# Patient Record
Sex: Male | Born: 1985 | Race: Black or African American | Hispanic: No | Marital: Single | Smoking: Never smoker
Health system: Southern US, Community
[De-identification: ages and names within clinical notes are randomized; demographics above are authoritative.]

## PROBLEM LIST (undated history)

## (undated) DIAGNOSIS — M795 Residual foreign body in soft tissue: Secondary | ICD-10-CM

## (undated) DIAGNOSIS — Z789 Other specified health status: Secondary | ICD-10-CM

## (undated) HISTORY — PX: NO PAST SURGERIES: SHX2092

---

## 2013-03-26 ENCOUNTER — Encounter (HOSPITAL_BASED_OUTPATIENT_CLINIC_OR_DEPARTMENT_OTHER): Payer: Self-pay | Admitting: Student

## 2013-03-26 ENCOUNTER — Emergency Department (HOSPITAL_BASED_OUTPATIENT_CLINIC_OR_DEPARTMENT_OTHER)
Admission: EM | Admit: 2013-03-26 | Discharge: 2013-03-26 | Disposition: A | Discharge status: Home / Self Care | Payer: Self-pay | Attending: Emergency Medicine | Admitting: Emergency Medicine

## 2013-03-26 DIAGNOSIS — L03211 Cellulitis of face: Secondary | ICD-10-CM

## 2013-03-26 DIAGNOSIS — L0201 Cutaneous abscess of face: Secondary | ICD-10-CM | POA: Insufficient documentation

## 2013-03-26 MED ORDER — CEPHALEXIN 250 MG PO CAPS
500.0000 mg | ORAL_CAPSULE | Freq: Once | ORAL | Status: AC
  Administered 2013-03-26: 500 mg via ORAL
  Filled 2013-03-26: qty 2

## 2013-03-26 MED ORDER — SULFAMETHOXAZOLE-TRIMETHOPRIM 800-160 MG PO TABS: 1.0000 | ORAL_TABLET | Freq: Two times a day (BID) | ORAL | Status: AC

## 2013-03-26 MED ORDER — CEPHALEXIN 500 MG PO CAPS: 500.0000 mg | ORAL_CAPSULE | Freq: Three times a day (TID) | ORAL | Status: DC

## 2013-03-26 MED ORDER — SULFAMETHOXAZOLE-TMP DS 800-160 MG PO TABS
1.0000 | ORAL_TABLET | Freq: Once | ORAL | Status: AC
  Administered 2013-03-26: 1 via ORAL
  Filled 2013-03-26: qty 1

## 2013-03-26 NOTE — ED Notes (Signed)
Pt in with c/o abscess on left outer ear x 3 days and reports possible exposure to MRSA

## 2013-03-26 NOTE — ED Provider Notes (Signed)
History     CSN: [id number]  Arrival date & time 03/26/13  1036   First MD Initiated Contact with Patient 03/26/13 1108      Chief Complaint  Patient presents with  . Abscess    outer left ear    (Consider location/radiation/quality/duration/timing/severity/associated sxs/prior treatment) HPI Comments: Pt had a small painful bump to left cheek just in front of left ear, possible bug bite.  Pt reports GF had a bump on arm, seen in ED and diagnosed with MRSA.  Pt denies prior h/o MRSA or abscess.  Pt tried to drain it himself, got it to open and drain some, but now has gotten more red and swollen adn painful, redness has spread a little to face.  No fevers, chills.    Patient is a 27 y.o. male presenting with abscess. The history is provided by the patient.  Abscess Location:  Head/neck Abscess quality: induration, painful, redness and warmth   Red streaking: no   Duration:  2 days Progression:  Worsening Pain details:    Quality:  Throbbing and aching   Severity:  Moderate   Duration:  1 day Chronicity:  New Context: skin injury   Context: not immunosuppression   Associated symptoms: no fever     History reviewed. No pertinent past medical history.  History reviewed. No pertinent past surgical history.  History reviewed. No pertinent family history.  History  Substance Use Topics  . Smoking status: Never Smoker   . Smokeless tobacco: Not on file  . Alcohol Use: Yes      Review of Systems  Constitutional: Negative for fever and chills.  HENT: Positive for facial swelling. Negative for hearing loss, neck pain, neck stiffness and tinnitus.   Skin: Positive for color change and wound.  Hematological: Negative for adenopathy.    Allergies  Review of patient's allergies indicates not on file.  Home Medications   Current Outpatient Rx  Name  Route  Sig  Dispense  Refill  . cephALEXin (KEFLEX) 500 MG capsule   Oral   Take 1 capsule (500 mg total) by mouth 3  (three) times daily.   30 capsule   0   . sulfamethoxazole-trimethoprim (BACTRIM DS,SEPTRA DS) 800-160 MG per tablet   Oral   Take 1 tablet by mouth 2 (two) times daily.   20 tablet   0     BP 128/87  Pulse 101  Temp(Src) 99.1 F (37.3 C) (Oral)  Resp 20  Ht 5\' 5"  (1.651 m)  Wt 120 lb (54.432 kg)  BMI 19.97 kg/m2  SpO2 100%  Physical Exam  Nursing note and vitals reviewed. Constitutional: He appears well-developed and well-nourished.  HENT:  Head: Normocephalic.    Eyes: EOM are normal.    ED Course  Procedures (including critical care time)  Labs Reviewed - No data to display No results found.   1. Cellulitis and abscess of face       MDM  Small abscess or furuncle, possibly began as ingrown hair or folliculitis, will treat with both keflex and bactrim, I also suspect early MRSA infection.  No significant area of fluctuance that requires I&D.        Gavin Pound. Kylina Vultaggio, MD 03/26/13 1137

## 2013-03-26 NOTE — Discharge Instructions (Signed)
Community-Associated MRSA °CA-MRSA stands for community-associated methicillin-resistant Staphylococcus aureus. MRSA is a type of bacteria that is resistant to some common antibiotics. It can cause infections in the skin and many other places in the body. Staphylococcus aureus, often called "staph," is a bacteria that normally lives on the skin or in the nose. Staph on the surface of the skin or in the nose does not cause problems. However, if the staph enters the body through a cut, wound, or break in the skin, an infection can happen. °Up until recently, infections with the MRSA type of staph mainly occurred in hospitals and other healthcare settings. There are now increasing problems with MRSA infections in the community as well. Infections with MRSA may be very serious or even life-threatening. °CA-MRSA is becoming more common. It is known to spread in crowded settings, in jails and prisons, and in situations where there is close skin-to-skin contact, such as during sporting events or in locker rooms. MRSA can be spread through shared items, such as children's toys, razors, towels, or sports equipment.  °CAUSES °All staph, including MRSA, are normally harmless unless they enter the body through a scratch, cut, or wound, such as with surgery. All staph, including MRSA, can be spread from person-to-person by touching contaminated objects or through direct contact. °· MRSA now causes illness in people who have not been in hospitals or other healthcare facilities. Cases of MRSA diseases in the community have been associated with: °· Recent antibiotic use. °· Sharing contaminated towels or clothes. °· Having active skin diseases. °· Participating in contact sports. °· Living in crowded settings. °· Intravenous (IV) drug use. °· Community-associated MRSA infections are usually skin infections, but may cause other severe illnesses. °· Staph bacteria are one of the most common causes of skin infection. However, they are  also a common cause of pneumonia, bone or joint infections, and bloodstream infections. °DIAGNOSIS °Diagnosis of MRSA is done by cultures of fluid samples that may come from: °· Swabs taken from cuts or wounds in infected areas. °· Nasal swabs. °· Saliva or deep cough specimens from the lungs (sputum). °· Urine. °· Blood. °Many people are "colonized" with MRSA but have no signs of infection. This means that people carry the MRSA germ on their skin or in their nose and may never develop MRSA infection.  °TREATMENT  °Treatment varies and is based on how serious, how deep, or how extensive the infection is. For example: °· Some skin infections, such as a small boil or abscess, may be treated by draining yellowish-white fluid (pus) from the site of the infection. °· Deeper or more widespread soft tissue infections are usually treated with surgery to drain pus and with antibiotic medicine given by vein or by mouth. This may be recommended even if you are pregnant. °· Serious infections may require a hospital stay. °If antibiotics are given, they may be needed for several weeks. °PREVENTION °Because many people are colonized with staph, including MRSA, preventing the spread of the bacteria from person-to-person is most important. The best way to prevent the spread of bacteria and other germs is through proper hand washing or by using alcohol-based hand disinfectants. The following are other ways to help prevent MRSA infection within community settings.  °· Wash your hands frequently with soap and water for at least 15 seconds. Otherwise, use alcohol-based hand disinfectants when soap and water is not available. °· Make sure people who live with you wash their hands often, too. °· Do not share   personal items. For example, avoid sharing razors and other personal hygiene items, towels, clothing, and athletic equipment. °· Wash and dry your clothes and bedding at the warmest temperatures recommended on the labels. °· Keep  wounds covered. Pus from infected sores may contain MRSA and other bacteria. Keep cuts and abrasions clean and covered with germ-free (sterile), dry bandages until they are healed. °· If you have a wound that appears infected, ask your caregiver if a culture for MRSA and other bacteria should be done. °· If you are breastfeeding, talk to your caregiver about MRSA. You may be asked to temporarily stop breastfeeding. °HOME CARE INSTRUCTIONS  °· Take your antibiotics as directed. Finish them even if you start to feel better. °· Avoid close contact with those around you as much as possible. Do not use towels, razors, toothbrushes, bedding, or other items that will be used by others. °· To fight the infection, follow your caregiver's instructions for wound care. Wash your hands before and after changing your bandages. °· If you have an intravascular device, such as a catheter, make sure you know how to care for it. °· Be sure to tell any healthcare providers that you have MRSA so they are aware of your infection. °SEEK IMMEDIATE MEDICAL CARE IF: °· The infection appears to be getting worse. Signs include: °· Increased warmth, redness, or tenderness around the wound site. °· A red line that extends from the infection site. °· A dark color in the area around the infection. °· Wound drainage that is tan, yellow, or green. °· A bad smell coming from the wound. °· You feel sick to your stomach (nauseous) and throw up (vomit) or cannot keep medicine down. °· You have a fever. °· Your baby is older than 3 months with a rectal temperature of 102° F (38.9° C) or higher. °· Your baby is 3 months old or younger with a rectal temperature of 100.4° F (38° C) or higher. °· You have difficulty breathing. °MAKE SURE YOU:  °· Understand these instructions. °· Will watch your condition. °· Will get help right away if you are not doing well or get worse. °Document Released: 12/28/2005 Document Revised: 12/17/2011 Document Reviewed:  12/28/2010 °[employer]® Patient Information ©2014 [employer]. ° °

## 2017-09-30 ENCOUNTER — Encounter (HOSPITAL_COMMUNITY): Payer: Self-pay | Admitting: Emergency Medicine

## 2017-09-30 ENCOUNTER — Inpatient Hospital Stay (HOSPITAL_COMMUNITY)
Admission: EM | Admit: 2017-09-30 | Discharge: 2017-10-01 | DRG: 563 | Disposition: A | Discharge status: Home / Self Care | Payer: PRIVATE HEALTH INSURANCE | Attending: Emergency Medicine | Admitting: Emergency Medicine

## 2017-09-30 ENCOUNTER — Inpatient Hospital Stay (HOSPITAL_COMMUNITY): Payer: PRIVATE HEALTH INSURANCE

## 2017-09-30 DIAGNOSIS — W3400XA Accidental discharge from unspecified firearms or gun, initial encounter: Secondary | ICD-10-CM

## 2017-09-30 DIAGNOSIS — M795 Residual foreign body in soft tissue: Secondary | ICD-10-CM

## 2017-09-30 DIAGNOSIS — S52002B Unspecified fracture of upper end of left ulna, initial encounter for open fracture type I or II: Secondary | ICD-10-CM | POA: Diagnosis present

## 2017-09-30 DIAGNOSIS — Z23 Encounter for immunization: Secondary | ICD-10-CM

## 2017-09-30 HISTORY — DX: Residual foreign body in soft tissue: M79.5

## 2017-09-30 LAB — CBC WITH DIFFERENTIAL/PLATELET
Basophils Absolute: 0 10*3/uL (ref 0.0–0.1)
Basophils Relative: 1 %
EOS ABS: 0.1 10*3/uL (ref 0.0–0.7)
Eosinophils Relative: 1 %
HEMATOCRIT: 43.4 % (ref 39.0–52.0)
HEMOGLOBIN: 15.5 g/dL (ref 13.0–17.0)
LYMPHS ABS: 2.8 10*3/uL (ref 0.7–4.0)
LYMPHS PCT: 32 %
MCH: 30.9 pg (ref 26.0–34.0)
MCHC: 35.7 g/dL (ref 30.0–36.0)
MCV: 86.6 fL (ref 78.0–100.0)
MONOS PCT: 4 %
Monocytes Absolute: 0.3 10*3/uL (ref 0.1–1.0)
NEUTROS ABS: 5.4 10*3/uL (ref 1.7–7.7)
Neutrophils Relative %: 62 %
Platelets: 295 10*3/uL (ref 150–400)
RBC: 5.01 MIL/uL (ref 4.22–5.81)
RDW: 12.8 % (ref 11.5–15.5)
WBC: 8.6 10*3/uL (ref 4.0–10.5)

## 2017-09-30 LAB — BASIC METABOLIC PANEL
Anion gap: 13 (ref 5–15)
BUN: 6 mg/dL (ref 6–20)
CHLORIDE: 106 mmol/L (ref 101–111)
CO2: 22 mmol/L (ref 22–32)
CREATININE: 1.13 mg/dL (ref 0.61–1.24)
Calcium: 9 mg/dL (ref 8.9–10.3)
GFR calc Af Amer: >60 mL/min (ref >60)
GFR calc non Af Amer: >60 mL/min (ref >60)
GLUCOSE: 122 mg/dL — AB (ref 65–99)
POTASSIUM: 3.1 mmol/L — AB (ref 3.5–5.1)
Sodium: 141 mmol/L (ref 135–145)

## 2017-09-30 LAB — PROTIME-INR
INR: 1.06
Prothrombin Time: 13.7 seconds (ref 11.4–15.2)

## 2017-09-30 LAB — ABO/RH: ABO/RH(D): B NEG

## 2017-09-30 MED ORDER — HYDROCODONE-ACETAMINOPHEN 5-325 MG PO TABS: 1.0000 | ORAL_TABLET | Freq: Four times a day (QID) | ORAL | 0 refills | Status: AC | PRN

## 2017-09-30 MED ORDER — METOPROLOL TARTRATE 5 MG/5ML IV SOLN: 5.0000 mg | Freq: Four times a day (QID) | INTRAVENOUS | Status: DC | PRN

## 2017-09-30 MED ORDER — SODIUM CHLORIDE 0.9 % IV BOLUS (SEPSIS)
1000.0000 mL | Freq: Once | INTRAVENOUS | Status: AC
  Administered 2017-09-30: 1000 mL via INTRAVENOUS

## 2017-09-30 MED ORDER — ONDANSETRON 4 MG PO TBDP: 4.0000 mg | ORAL_TABLET | Freq: Four times a day (QID) | ORAL | Status: DC | PRN

## 2017-09-30 MED ORDER — TETANUS-DIPHTH-ACELL PERTUSSIS 5-2.5-18.5 LF-MCG/0.5 IM SUSP
0.5000 mL | Freq: Once | INTRAMUSCULAR | Status: AC
  Administered 2017-09-30: 0.5 mL via INTRAMUSCULAR

## 2017-09-30 MED ORDER — TETANUS-DIPHTH-ACELL PERTUSSIS 5-2.5-18.5 LF-MCG/0.5 IM SUSP
INTRAMUSCULAR | Status: AC
  Filled 2017-09-30: qty 0.5

## 2017-09-30 MED ORDER — CEPHALEXIN 500 MG PO CAPS: 500.0000 mg | ORAL_CAPSULE | Freq: Two times a day (BID) | ORAL | 0 refills | Status: AC

## 2017-09-30 MED ORDER — ACETAMINOPHEN 325 MG PO TABS: 650.0000 mg | ORAL_TABLET | ORAL | Status: DC | PRN

## 2017-09-30 MED ORDER — CEFAZOLIN SODIUM-DEXTROSE 2-4 GM/100ML-% IV SOLN
INTRAVENOUS | Status: AC
  Filled 2017-09-30: qty 100

## 2017-09-30 MED ORDER — FENTANYL CITRATE (PF) 100 MCG/2ML IJ SOLN
INTRAMUSCULAR | Status: AC
  Filled 2017-09-30: qty 2

## 2017-09-30 MED ORDER — ONDANSETRON HCL 4 MG/2ML IJ SOLN: 4.0000 mg | Freq: Four times a day (QID) | INTRAMUSCULAR | Status: DC | PRN

## 2017-09-30 MED ORDER — SODIUM CHLORIDE 0.9 % IV SOLN
INTRAVENOUS | Status: DC
  Administered 2017-09-30: 21:00:00 via INTRAVENOUS

## 2017-09-30 MED ORDER — MORPHINE SULFATE (PF) 4 MG/ML IV SOLN
2.0000 mg | INTRAVENOUS | Status: DC | PRN
  Administered 2017-09-30: 4 mg via INTRAVENOUS
  Filled 2017-09-30: qty 1

## 2017-09-30 MED ORDER — DOCUSATE SODIUM 100 MG PO CAPS: 100.0000 mg | ORAL_CAPSULE | Freq: Two times a day (BID) | ORAL | Status: DC

## 2017-09-30 MED ORDER — FENTANYL CITRATE (PF) 100 MCG/2ML IJ SOLN
50.0000 ug | Freq: Once | INTRAMUSCULAR | Status: AC
  Administered 2017-09-30: 50 ug via INTRAVENOUS

## 2017-09-30 MED ORDER — CEFAZOLIN SODIUM-DEXTROSE 1-4 GM/50ML-% IV SOLN
1.0000 g | Freq: Once | INTRAVENOUS | Status: AC
  Administered 2017-09-30: 1 g via INTRAVENOUS
  Filled 2017-09-30: qty 50

## 2017-09-30 MED ORDER — OXYCODONE HCL 5 MG PO TABS: 5.0000 mg | ORAL_TABLET | ORAL | Status: DC | PRN
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
## 2017-09-30 NOTE — Progress Notes (Signed)
Orthopedic Tech Progress Note Patient Details:  Sergio CorwinKenneth XXXBroughton 09/26/1986 [id number]  Ortho Devices Type of Ortho Device: Post (long arm) splint, Arm sling Ortho Device/Splint Location: lue Ortho Device/Splint Interventions: Ordered, Application, Adjustment   Post Interventions Patient Tolerated: Well Instructions Provided: Care of device, Adjustment of device   Trinna PostMartinez, Erie Sica J 09/30/2017, 11:24 PM

## 2017-09-30 NOTE — ED Notes (Signed)
Paged Hand Doc
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
## 2017-10-01 LAB — BPAM RBC
BLOOD PRODUCT EXPIRATION DATE: 201901102359
BLOOD PRODUCT EXPIRATION DATE: 201901102359
ISSUE DATE / TIME: 201812242023
ISSUE DATE / TIME: 201812242023
UNIT TYPE AND RH: 9500
UNIT TYPE AND RH: 9500

## 2017-10-01 LAB — BPAM FFP
BLOOD PRODUCT EXPIRATION DATE: 201812272359
BLOOD PRODUCT EXPIRATION DATE: 201812272359
ISSUE DATE / TIME: 201812242023
ISSUE DATE / TIME: 201812242023
UNIT TYPE AND RH: 6200
Unit Type and Rh: 6200

## 2017-10-01 LAB — TYPE AND SCREEN
ABO/RH(D): B NEG
Antibody Screen: NEGATIVE
UNIT DIVISION: 0
Unit division: 0

## 2017-10-01 LAB — PREPARE FRESH FROZEN PLASMA
UNIT DIVISION: 0
Unit division: 0

## 2017-10-01 LAB — HIV ANTIBODY (ROUTINE TESTING W REFLEX): HIV Screen 4th Generation wRfx: NONREACTIVE

## 2017-10-03 ENCOUNTER — Encounter (HOSPITAL_BASED_OUTPATIENT_CLINIC_OR_DEPARTMENT_OTHER): Payer: Self-pay | Admitting: Student

## 2017-10-03 ENCOUNTER — Other Ambulatory Visit: Payer: Self-pay | Admitting: Orthopedic Surgery

## 2017-10-04 ENCOUNTER — Encounter (HOSPITAL_BASED_OUTPATIENT_CLINIC_OR_DEPARTMENT_OTHER): Payer: Self-pay | Admitting: Certified Registered Nurse Anesthetist

## 2017-10-04 ENCOUNTER — Ambulatory Visit (HOSPITAL_BASED_OUTPATIENT_CLINIC_OR_DEPARTMENT_OTHER): Payer: PRIVATE HEALTH INSURANCE | Admitting: Certified Registered Nurse Anesthetist

## 2017-10-04 ENCOUNTER — Other Ambulatory Visit: Payer: Self-pay

## 2017-10-04 ENCOUNTER — Encounter (HOSPITAL_BASED_OUTPATIENT_CLINIC_OR_DEPARTMENT_OTHER)
Admission: RE | Disposition: A | Discharge status: Home / Self Care | Payer: Self-pay | Source: Ambulatory Visit | Attending: Orthopedic Surgery

## 2017-10-04 ENCOUNTER — Ambulatory Visit (HOSPITAL_BASED_OUTPATIENT_CLINIC_OR_DEPARTMENT_OTHER)
Admission: RE | Admit: 2017-10-04 | Discharge: 2017-10-04 | Disposition: A | Discharge status: Home / Self Care | Payer: PRIVATE HEALTH INSURANCE | Source: Ambulatory Visit | Attending: Orthopedic Surgery | Admitting: Orthopedic Surgery

## 2017-10-04 DIAGNOSIS — Y939 Activity, unspecified: Secondary | ICD-10-CM | POA: Diagnosis not present

## 2017-10-04 DIAGNOSIS — S52092B Other fracture of upper end of left ulna, initial encounter for open fracture type I or II: Secondary | ICD-10-CM | POA: Insufficient documentation

## 2017-10-04 DIAGNOSIS — S51832A Puncture wound without foreign body of left forearm, initial encounter: Secondary | ICD-10-CM | POA: Diagnosis present

## 2017-10-04 HISTORY — DX: Residual foreign body in soft tissue: M79.5

## 2017-10-04 HISTORY — DX: Other specified health status: Z78.9

## 2017-10-04 HISTORY — PX: ORIF ULNAR FRACTURE: SHX5417

## 2017-10-04 SURGERY — OPEN REDUCTION INTERNAL FIXATION (ORIF) ULNAR FRACTURE
Anesthesia: General | Site: Arm Lower | Laterality: Left

## 2017-10-04 MED ORDER — FENTANYL CITRATE (PF) 100 MCG/2ML IJ SOLN
INTRAMUSCULAR | Status: AC
  Filled 2017-10-04: qty 2

## 2017-10-04 MED ORDER — OXYCODONE HCL 5 MG/5ML PO SOLN
5.0000 mg | Freq: Once | ORAL | Status: DC | PRN
Start: 2017-10-04 — End: 2017-10-04

## 2017-10-04 MED ORDER — HYDROMORPHONE HCL 1 MG/ML IJ SOLN: 0.2500 mg | INTRAMUSCULAR | Status: DC | PRN

## 2017-10-04 MED ORDER — PROMETHAZINE HCL 25 MG/ML IJ SOLN: 6.2500 mg | INTRAMUSCULAR | Status: DC | PRN

## 2017-10-04 MED ORDER — CHLORHEXIDINE GLUCONATE 4 % EX LIQD: 60.0000 mL | Freq: Once | CUTANEOUS | Status: DC

## 2017-10-04 MED ORDER — FENTANYL CITRATE (PF) 100 MCG/2ML IJ SOLN
INTRAMUSCULAR | Status: DC | PRN
  Administered 2017-10-04: 25 ug via INTRAVENOUS

## 2017-10-04 MED ORDER — DEXAMETHASONE SODIUM PHOSPHATE 10 MG/ML IJ SOLN
INTRAMUSCULAR | Status: DC | PRN
  Administered 2017-10-04: 10 mg via INTRAVENOUS

## 2017-10-04 MED ORDER — LIDOCAINE 2% (20 MG/ML) 5 ML SYRINGE
INTRAMUSCULAR | Status: AC
  Filled 2017-10-04: qty 5

## 2017-10-04 MED ORDER — MIDAZOLAM HCL 2 MG/2ML IJ SOLN
1.0000 mg | INTRAMUSCULAR | Status: DC | PRN
  Administered 2017-10-04: 2 mg via INTRAVENOUS

## 2017-10-04 MED ORDER — CEFAZOLIN SODIUM-DEXTROSE 2-4 GM/100ML-% IV SOLN
2.0000 g | INTRAVENOUS | Status: AC
  Administered 2017-10-04: 2 g via INTRAVENOUS

## 2017-10-04 MED ORDER — MEPERIDINE HCL 25 MG/ML IJ SOLN: 6.2500 mg | INTRAMUSCULAR | Status: DC | PRN

## 2017-10-04 MED ORDER — CEFAZOLIN SODIUM-DEXTROSE 2-4 GM/100ML-% IV SOLN
INTRAVENOUS | Status: AC
  Filled 2017-10-04: qty 100

## 2017-10-04 MED ORDER — OXYCODONE HCL 5 MG PO TABS: 5.0000 mg | ORAL_TABLET | Freq: Once | ORAL | Status: DC | PRN

## 2017-10-04 MED ORDER — ROPIVACAINE HCL 5 MG/ML IJ SOLN
INTRAMUSCULAR | Status: DC | PRN
  Administered 2017-10-04: 30 mL via PERINEURAL

## 2017-10-04 MED ORDER — ONDANSETRON HCL 4 MG/2ML IJ SOLN
INTRAMUSCULAR | Status: AC
Start: 2017-10-04 — End: ?
  Filled 2017-10-04: qty 2

## 2017-10-04 MED ORDER — MIDAZOLAM HCL 2 MG/2ML IJ SOLN
INTRAMUSCULAR | Status: AC
  Filled 2017-10-04: qty 2

## 2017-10-04 MED ORDER — OXYCODONE-ACETAMINOPHEN 5-325 MG PO TABS: 1.0000 | ORAL_TABLET | ORAL | 0 refills | Status: AC | PRN

## 2017-10-04 MED ORDER — LACTATED RINGERS IV SOLN
INTRAVENOUS | Status: DC
  Administered 2017-10-04 (×2): via INTRAVENOUS

## 2017-10-04 MED ORDER — FENTANYL CITRATE (PF) 100 MCG/2ML IJ SOLN
50.0000 ug | INTRAMUSCULAR | Status: DC | PRN
  Administered 2017-10-04: 100 ug via INTRAVENOUS

## 2017-10-04 MED ORDER — ONDANSETRON HCL 4 MG/2ML IJ SOLN
INTRAMUSCULAR | Status: DC | PRN
  Administered 2017-10-04: 4 mg via INTRAVENOUS

## 2017-10-04 MED ORDER — PHENYLEPHRINE 40 MCG/ML (10ML) SYRINGE FOR IV PUSH (FOR BLOOD PRESSURE SUPPORT)
PREFILLED_SYRINGE | INTRAVENOUS | Status: DC | PRN
  Administered 2017-10-04 (×3): 80 ug via INTRAVENOUS

## 2017-10-04 MED ORDER — PHENYLEPHRINE 40 MCG/ML (10ML) SYRINGE FOR IV PUSH (FOR BLOOD PRESSURE SUPPORT)
PREFILLED_SYRINGE | INTRAVENOUS | Status: AC
  Filled 2017-10-04: qty 10

## 2017-10-04 MED ORDER — PROPOFOL 10 MG/ML IV BOLUS
INTRAVENOUS | Status: DC | PRN
  Administered 2017-10-04: 200 mg via INTRAVENOUS

## 2017-10-04 MED ORDER — DEXAMETHASONE SODIUM PHOSPHATE 10 MG/ML IJ SOLN
INTRAMUSCULAR | Status: AC
  Filled 2017-10-04: qty 1

## 2017-10-04 MED ORDER — LIDOCAINE 2% (20 MG/ML) 5 ML SYRINGE
INTRAMUSCULAR | Status: DC | PRN
  Administered 2017-10-04: 100 mg via INTRAVENOUS

## 2017-10-04 SURGICAL SUPPLY — 87 items
APL SKNCLS STERI-STRIP NONHPOA (GAUZE/BANDAGES/DRESSINGS) ×1
BAG DECANTER FOR FLEXI CONT (MISCELLANEOUS) IMPLANT
BANDAGE ACE 3X5.8 VEL STRL LF (GAUZE/BANDAGES/DRESSINGS) IMPLANT
BANDAGE ACE 4X5 VEL STRL LF (GAUZE/BANDAGES/DRESSINGS) ×9 IMPLANT
BENZOIN TINCTURE PRP APPL 2/3 (GAUZE/BANDAGES/DRESSINGS) ×3 IMPLANT
BIT DRILL 2.5X2.75 QC CALB (BIT) ×3 IMPLANT
BLADE MINI RND TIP GREEN BEAV (BLADE) IMPLANT
BLADE SURG 15 STRL LF DISP TIS (BLADE) ×2 IMPLANT
BLADE SURG 15 STRL SS (BLADE) ×6
BNDG CMPR 9X4 STRL LF SNTH (GAUZE/BANDAGES/DRESSINGS) ×1
BNDG COHESIVE 4X5 TAN STRL (GAUZE/BANDAGES/DRESSINGS) IMPLANT
BNDG ESMARK 4X9 LF (GAUZE/BANDAGES/DRESSINGS) ×3 IMPLANT
BNDG GAUZE ELAST 4 BULKY (GAUZE/BANDAGES/DRESSINGS) ×3 IMPLANT
CANISTER SUCT 1200ML W/VALVE (MISCELLANEOUS) IMPLANT
CLOSURE WOUND 1/2 X4 (GAUZE/BANDAGES/DRESSINGS) ×1
CORD BIPOLAR FORCEPS 12FT (ELECTRODE) ×3 IMPLANT
COVER BACK TABLE 60X90IN (DRAPES) ×3 IMPLANT
CUFF TOURN SGL LL 18 NRW (TOURNIQUET CUFF) ×3 IMPLANT
CUFF TOURNIQUET SINGLE 18IN (TOURNIQUET CUFF) ×3 IMPLANT
DECANTER SPIKE VIAL GLASS SM (MISCELLANEOUS) IMPLANT
DRAPE EXTREMITY T 121X128X90 (DRAPE) ×3 IMPLANT
DRAPE OEC MINIVIEW 54X84 (DRAPES) ×3 IMPLANT
DRAPE SURG 17X23 STRL (DRAPES) ×3 IMPLANT
DURAPREP 26ML APPLICATOR (WOUND CARE) ×3 IMPLANT
ELECT REM PT RETURN 9FT ADLT (ELECTROSURGICAL)
ELECTRODE REM PT RTRN 9FT ADLT (ELECTROSURGICAL) IMPLANT
GAUZE SPONGE 4X4 12PLY STRL (GAUZE/BANDAGES/DRESSINGS) ×3 IMPLANT
GAUZE SPONGE 4X4 16PLY XRAY LF (GAUZE/BANDAGES/DRESSINGS) IMPLANT
GAUZE XEROFORM 1X8 LF (GAUZE/BANDAGES/DRESSINGS) IMPLANT
GLOVE BIO SURGEON STRL SZ 6.5 (GLOVE) ×2 IMPLANT
GLOVE BIO SURGEONS STRL SZ 6.5 (GLOVE) ×1
GLOVE BIOGEL M STRL SZ7.5 (GLOVE) ×6 IMPLANT
GLOVE BIOGEL PI IND STRL 7.0 (GLOVE) ×1 IMPLANT
GLOVE BIOGEL PI IND STRL 8 (GLOVE) ×2 IMPLANT
GLOVE BIOGEL PI INDICATOR 7.0 (GLOVE) ×2
GLOVE BIOGEL PI INDICATOR 8 (GLOVE) ×4
GLOVE SURG SYN 8.0 (GLOVE) ×6 IMPLANT
GOWN STRL REUS W/ TWL LRG LVL3 (GOWN DISPOSABLE) ×1 IMPLANT
GOWN STRL REUS W/TWL LRG LVL3 (GOWN DISPOSABLE) ×3
GOWN STRL REUS W/TWL XL LVL3 (GOWN DISPOSABLE) ×6 IMPLANT
NEEDLE HYPO 25X1 1.5 SAFETY (NEEDLE) IMPLANT
NS IRRIG 1000ML POUR BTL (IV SOLUTION) ×3 IMPLANT
PACK BASIN DAY SURGERY FS (CUSTOM PROCEDURE TRAY) ×3 IMPLANT
PAD CAST 3X4 CTTN HI CHSV (CAST SUPPLIES) ×1 IMPLANT
PAD CAST 4YDX4 CTTN HI CHSV (CAST SUPPLIES) IMPLANT
PADDING CAST ABS 4INX4YD NS (CAST SUPPLIES) ×2
PADDING CAST ABS COTTON 4X4 ST (CAST SUPPLIES) ×1 IMPLANT
PADDING CAST COTTON 3X4 STRL (CAST SUPPLIES) ×3
PADDING CAST COTTON 4X4 STRL (CAST SUPPLIES)
PENCIL BUTTON HOLSTER BLD 10FT (ELECTRODE) IMPLANT
PLATE LOCK COMP 8H 3.5 FOOT (Plate) ×3 IMPLANT
SCREW CORTICAL 3.5MM  20MM (Screw) ×2 IMPLANT
SCREW CORTICAL 3.5MM 14MM (Screw) ×3 IMPLANT
SCREW CORTICAL 3.5MM 20MM (Screw) ×1 IMPLANT
SCREW CORTICAL 3.5MM 22MM (Screw) ×6 IMPLANT
SCREW CORTICAL 3.5MM 24MM (Screw) ×9 IMPLANT
SHEET MEDIUM DRAPE 40X70 STRL (DRAPES) ×6 IMPLANT
SLING ARM FOAM STRAP LRG (SOFTGOODS) ×3 IMPLANT
SPLINT FAST PLASTER 5X30 (CAST SUPPLIES)
SPLINT PLASTER CAST FAST 5X30 (CAST SUPPLIES) IMPLANT
SPLINT PLASTER CAST XFAST 3X15 (CAST SUPPLIES) IMPLANT
SPLINT PLASTER CAST XFAST 4X15 (CAST SUPPLIES) IMPLANT
SPLINT PLASTER XTRA FAST SET 4 (CAST SUPPLIES)
SPLINT PLASTER XTRA FASTSET 3X (CAST SUPPLIES)
STAPLER VISISTAT 35W (STAPLE) IMPLANT
STOCKINETTE 4X48 STRL (DRAPES) ×3 IMPLANT
STRIP CLOSURE SKIN 1/2X4 (GAUZE/BANDAGES/DRESSINGS) ×2 IMPLANT
SUCTION FRAZIER HANDLE 10FR (MISCELLANEOUS)
SUCTION TUBE FRAZIER 10FR DISP (MISCELLANEOUS) IMPLANT
SUT ETHILON 4 0 PS 2 18 (SUTURE) IMPLANT
SUT MERSILENE 4 0 P 3 (SUTURE) IMPLANT
SUT PROLENE 3 0 PS 2 (SUTURE) ×3 IMPLANT
SUT SILK 2 0 PERMA HAND 18 BK (SUTURE) IMPLANT
SUT VIC AB 0 CT1 27 (SUTURE) ×3
SUT VIC AB 0 CT1 27XBRD ANBCTR (SUTURE) ×1 IMPLANT
SUT VIC AB 0 SH 27 (SUTURE) IMPLANT
SUT VIC AB 2-0 SH 27 (SUTURE) ×3
SUT VIC AB 2-0 SH 27XBRD (SUTURE) ×1 IMPLANT
SUT VIC AB 3-0 FS2 27 (SUTURE) ×3 IMPLANT
SUT VICRYL RAPIDE 4-0 (SUTURE) IMPLANT
SUT VICRYL RAPIDE 4/0 PS 2 (SUTURE) IMPLANT
SYR 10ML LL (SYRINGE) IMPLANT
SYR BULB 3OZ (MISCELLANEOUS) ×3 IMPLANT
TOWEL OR 17X24 6PK STRL BLUE (TOWEL DISPOSABLE) ×6 IMPLANT
TUBE CONNECTING 20'X1/4 (TUBING) ×1
TUBE CONNECTING 20X1/4 (TUBING) ×2 IMPLANT
UNDERPAD 30X30 (UNDERPADS AND DIAPERS) ×3 IMPLANT

## 2017-10-04 NOTE — Progress Notes (Signed)
Assisted Dr. Miller with left, ultrasound guided, supraclavicular block. Side rails up, monitors on throughout procedure. See vital signs in flow sheet. Tolerated Procedure well. 

## 2017-10-04 NOTE — Anesthesia Preprocedure Evaluation (Signed)
Anesthesia Evaluation  Patient identified by MRN, date of birth, ID band Patient awake    Reviewed: Allergy & Precautions, NPO status , Patient's Chart, lab work & pertinent test results  Airway Mallampati: II  TM Distance: >3 FB Neck ROM: Full    Dental no notable dental hx.    Pulmonary neg pulmonary ROS,    Pulmonary exam normal breath sounds clear to auscultation       Cardiovascular negative cardio ROS Normal cardiovascular exam Rhythm:Regular Rate:Normal     Neuro/Psych negative neurological ROS  negative psych ROS   GI/Hepatic negative GI ROS, Neg liver ROS,   Endo/Other  negative endocrine ROS  Renal/GU negative Renal ROS  negative genitourinary   Musculoskeletal negative musculoskeletal ROS (+)   Abdominal   Peds negative pediatric ROS (+)  Hematology negative hematology ROS (+)   Anesthesia Other Findings   Reproductive/Obstetrics negative OB ROS                             Anesthesia Physical Anesthesia Plan  ASA: I  Anesthesia Plan: General   Post-op Pain Management: GA combined w/ Regional for post-op pain   Induction: Intravenous  PONV Risk Score and Plan: 2 and Ondansetron and Midazolam  Airway Management Planned: LMA  Additional Equipment:   Intra-op Plan:   Post-operative Plan: Extubation in OR  Informed Consent: I have reviewed the patients History and Physical, chart, labs and discussed the procedure including the risks, benefits and alternatives for the proposed anesthesia with the patient or authorized representative who has indicated his/her understanding and acceptance.     Dental advisory given  Plan Discussed with: CRNA  Anesthesia Plan Comments:         Anesthesia Quick Evaluation  

## 2017-10-04 NOTE — Discharge Instructions (Signed)

## 2017-10-04 NOTE — Transfer of Care (Signed)
Immediate Anesthesia Transfer of Care Note  Patient: Sergio BlankKenneth Mallicoat  Procedure(s) Performed: OPEN REDUCTION INTERNAL FIXATION (ORIF) ULNAR FRACTURE (Left Arm Lower)  Patient Location: PACU  Anesthesia Type:GA combined with regional for post-op pain  Level of Consciousness: awake, alert  and oriented  Airway & Oxygen Therapy: Patient Spontanous Breathing and Patient connected to face mask oxygen  Post-op Assessment: Report given to RN and Post -op Vital signs reviewed and stable  Post vital signs: Reviewed and stable  Last Vitals:  Vitals:   10/04/17 1400 10/04/17 1415  BP: 99/80 122/84  Pulse: 100 96  Resp: 17 20  Temp:    SpO2: 98% 99%    Last Pain:  Vitals:   10/04/17 1415  TempSrc:   PainSc: 0-No pain      Patients Stated Pain Goal: 3 (10/04/17 1240)  Complications: No apparent anesthesia complications
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
## 2017-10-04 NOTE — Op Note (Signed)
Please see dictated report [phone]

## 2017-10-04 NOTE — Anesthesia Procedure Notes (Signed)
Procedure Name: LMA Insertion Date/Time: 10/04/2017 2:58 PM Performed by: Pearson Grippeobertson, Ketsia Linebaugh M, CRNA Pre-anesthesia Checklist: Patient identified, Emergency Drugs available, Suction available and Patient being monitored Patient Re-evaluated:Patient Re-evaluated prior to induction Oxygen Delivery Method: Circle system utilized Preoxygenation: Pre-oxygenation with 100% oxygen Induction Type: IV induction Ventilation: Mask ventilation without difficulty LMA: LMA inserted LMA Size: 4.0 Number of attempts: 1 Airway Equipment and Method: Bite block Placement Confirmation: positive ETCO2 Tube secured with: Tape Dental Injury: Teeth and Oropharynx as per pre-operative assessment

## 2017-10-07 ENCOUNTER — Encounter (HOSPITAL_BASED_OUTPATIENT_CLINIC_OR_DEPARTMENT_OTHER): Payer: Self-pay | Admitting: Orthopedic Surgery

## 2017-10-07 NOTE — Anesthesia Postprocedure Evaluation (Signed)
Anesthesia Post Note  Patient: Sergio BlankKenneth Roberson  Procedure(s) Performed: OPEN REDUCTION INTERNAL FIXATION (ORIF) ULNAR FRACTURE (Left Arm Lower)     Anesthesia Type: General    Last Vitals:  Vitals:   10/04/17 1700 10/04/17 1730  BP: (!) 138/98 (!) 131/95  Pulse: 97 88  Resp: 16 16  Temp:  36.4 C  SpO2: 99% 100%    Last Pain:  Vitals:   10/04/17 1730  TempSrc:   PainSc: 0-No pain   Pain Goal: Patients Stated Pain Goal: 3 (10/04/17 1240)               Lowella CurbWarren Ray Aqib Lough
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
## 2019-09-18 ENCOUNTER — Other Ambulatory Visit: Payer: Self-pay

## 2019-09-18 ENCOUNTER — Emergency Department (HOSPITAL_BASED_OUTPATIENT_CLINIC_OR_DEPARTMENT_OTHER): Payer: 59

## 2019-09-18 ENCOUNTER — Encounter (HOSPITAL_BASED_OUTPATIENT_CLINIC_OR_DEPARTMENT_OTHER): Payer: Self-pay | Admitting: *Deleted

## 2019-09-18 ENCOUNTER — Emergency Department (HOSPITAL_BASED_OUTPATIENT_CLINIC_OR_DEPARTMENT_OTHER)
Admission: EM | Admit: 2019-09-18 | Discharge: 2019-09-18 | Disposition: A | Discharge status: Home / Self Care | Payer: 59 | Attending: Emergency Medicine | Admitting: Emergency Medicine

## 2019-09-18 DIAGNOSIS — S0990XA Unspecified injury of head, initial encounter: Secondary | ICD-10-CM | POA: Diagnosis present

## 2019-09-18 DIAGNOSIS — Y939 Activity, unspecified: Secondary | ICD-10-CM | POA: Insufficient documentation

## 2019-09-18 DIAGNOSIS — Y999 Unspecified external cause status: Secondary | ICD-10-CM | POA: Insufficient documentation

## 2019-09-18 DIAGNOSIS — S01412A Laceration without foreign body of left cheek and temporomandibular area, initial encounter: Secondary | ICD-10-CM | POA: Insufficient documentation

## 2019-09-18 DIAGNOSIS — S022XXB Fracture of nasal bones, initial encounter for open fracture: Secondary | ICD-10-CM

## 2019-09-18 DIAGNOSIS — S0181XA Laceration without foreign body of other part of head, initial encounter: Secondary | ICD-10-CM

## 2019-09-18 DIAGNOSIS — Y92017 Garden or yard in single-family (private) house as the place of occurrence of the external cause: Secondary | ICD-10-CM | POA: Insufficient documentation

## 2019-09-18 MED ORDER — LIDOCAINE HCL (PF) 1 % IJ SOLN
10.0000 mL | Freq: Once | INTRAMUSCULAR | Status: AC
  Administered 2019-09-18: 10 mL via INTRADERMAL
  Filled 2019-09-18: qty 10

## 2019-09-18 MED ORDER — CEPHALEXIN 500 MG PO CAPS: 500.0000 mg | ORAL_CAPSULE | Freq: Two times a day (BID) | ORAL | 0 refills | Status: AC
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
